# Patient Record
Sex: Male | Born: 1960 | Race: Black or African American | Hispanic: No | Marital: Single | Smoking: Never smoker
Health system: Southern US, Community
[De-identification: ages and names within clinical notes are randomized; demographics above are authoritative.]

## PROBLEM LIST (undated history)

## (undated) DIAGNOSIS — G5600 Carpal tunnel syndrome, unspecified upper limb: Secondary | ICD-10-CM

## (undated) DIAGNOSIS — N182 Chronic kidney disease, stage 2 (mild): Secondary | ICD-10-CM

## (undated) DIAGNOSIS — I10 Essential (primary) hypertension: Secondary | ICD-10-CM

## (undated) DIAGNOSIS — C61 Malignant neoplasm of prostate: Secondary | ICD-10-CM

## (undated) DIAGNOSIS — K209 Esophagitis, unspecified without bleeding: Secondary | ICD-10-CM

## (undated) DIAGNOSIS — C8310 Mantle cell lymphoma, unspecified site: Secondary | ICD-10-CM

---

## 2018-08-30 ENCOUNTER — Encounter (HOSPITAL_BASED_OUTPATIENT_CLINIC_OR_DEPARTMENT_OTHER): Payer: Self-pay | Admitting: Emergency Medicine

## 2018-08-30 ENCOUNTER — Other Ambulatory Visit: Payer: Self-pay

## 2018-08-30 ENCOUNTER — Emergency Department (HOSPITAL_BASED_OUTPATIENT_CLINIC_OR_DEPARTMENT_OTHER)
Admission: EM | Admit: 2018-08-30 | Discharge: 2018-08-30 | Disposition: A | Discharge status: Home / Self Care | Payer: Medicaid Other | Attending: Emergency Medicine | Admitting: Emergency Medicine

## 2018-08-30 DIAGNOSIS — I129 Hypertensive chronic kidney disease with stage 1 through stage 4 chronic kidney disease, or unspecified chronic kidney disease: Secondary | ICD-10-CM | POA: Diagnosis not present

## 2018-08-30 DIAGNOSIS — Z79899 Other long term (current) drug therapy: Secondary | ICD-10-CM | POA: Diagnosis not present

## 2018-08-30 DIAGNOSIS — H5713 Ocular pain, bilateral: Secondary | ICD-10-CM | POA: Insufficient documentation

## 2018-08-30 DIAGNOSIS — N182 Chronic kidney disease, stage 2 (mild): Secondary | ICD-10-CM | POA: Insufficient documentation

## 2018-08-30 DIAGNOSIS — Z8572 Personal history of non-Hodgkin lymphomas: Secondary | ICD-10-CM | POA: Diagnosis not present

## 2018-08-30 DIAGNOSIS — Z9221 Personal history of antineoplastic chemotherapy: Secondary | ICD-10-CM | POA: Insufficient documentation

## 2018-08-30 HISTORY — DX: Chronic kidney disease, stage 2 (mild): N18.2

## 2018-08-30 HISTORY — DX: Carpal tunnel syndrome, unspecified upper limb: G56.00

## 2018-08-30 HISTORY — DX: Mantle cell lymphoma, unspecified site: C83.10

## 2018-08-30 HISTORY — DX: Esophagitis, unspecified without bleeding: K20.90

## 2018-08-30 HISTORY — DX: Essential (primary) hypertension: I10

## 2018-08-30 MED ORDER — FLUORESCEIN SODIUM 1 MG OP STRP
1.0000 | ORAL_STRIP | Freq: Once | OPHTHALMIC | Status: AC
  Administered 2018-08-30: 1 via OPHTHALMIC
  Filled 2018-08-30: qty 1

## 2018-08-30 MED ORDER — CYCLOPENTOLATE HCL 1 % OP SOLN
2.0000 [drp] | Freq: Once | OPHTHALMIC | Status: AC
  Administered 2018-08-30: 2 [drp] via OPHTHALMIC
  Filled 2018-08-30: qty 2

## 2018-08-30 MED ORDER — HYDROCODONE-ACETAMINOPHEN 5-325 MG PO TABS: 1.0000 | ORAL_TABLET | ORAL | 0 refills | Status: AC | PRN

## 2018-08-30 MED ORDER — OFLOXACIN 0.3 % OP SOLN
1.0000 [drp] | OPHTHALMIC | Status: DC
  Administered 2018-08-30: 1 [drp] via OPHTHALMIC
  Filled 2018-08-30: qty 5

## 2018-08-30 MED ORDER — HYDROCODONE-ACETAMINOPHEN 5-325 MG PO TABS
1.0000 | ORAL_TABLET | Freq: Once | ORAL | Status: AC
  Administered 2018-08-30: 1 via ORAL
  Filled 2018-08-30: qty 1

## 2018-08-30 MED ORDER — TETRACAINE HCL 0.5 % OP SOLN
2.0000 [drp] | Freq: Once | OPHTHALMIC | Status: AC
  Administered 2018-08-30: 2 [drp] via OPHTHALMIC
  Filled 2018-08-30: qty 4

## 2018-08-30 NOTE — ED Provider Notes (Addendum)
[facility] Provider Note: Georgena Spurling, MD, FACEP  CSN: [id number] MRN: [id number] ARRIVAL: 08/30/18 at 0357 ROOM: [facility]   HISTORY OF PRESENT ILLNESS  08/30/18 4:11 AM Lucas Parker is a 58 y.o. male with bilateral eye burning and watering since 3 PM yesterday afternoon.  He rates his pain as a 10 out of 10, worse with exposure to light.  His vision is somewhat affected and he describes seeing what looks like a film.  He describes the pain is being present in his eyes diffusely.  He denies any ultraviolet exposure or other trauma to his eyes.  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    Past Medical History:  Diagnosis Date  . Carpal tunnel syndrome   . Esophagitis   . Hypertension   . Mantle cell lymphoma ([place])   . Stage 2 chronic kidney disease     History reviewed. No pertinent surgical history.  No family history on file.  Social History   Tobacco Use  . Smoking status: Not on file  Substance Use Topics  . Alcohol use: Not on file  . Drug use: Not on file    Prior to Admission medications   Medication Sig Start Date End Date Taking? Authorizing Provider  amLODipine (NORVASC) 5 MG tablet TAKE 1 TABLET BY MOUTH DAILY. 06/28/18  Yes [provider]  atorvastatin (LIPITOR) 20 MG tablet Take by mouth. 07/15/18  Yes [provider]  baclofen (LIORESAL) 10 MG tablet Take by mouth. 11/27/17  Yes [provider]  clindamycin (CLEOCIN T) 1 % external solution Apply twice a day on back lesions 01/29/18  Yes [provider]  dapsone 100 MG tablet Take by mouth. 07/31/18  Yes [provider]  Dextromethorphan-guaiFENesin (ROBITUSSIN COUGH+CHEST CONG DM) 20-200 MG/20ML LIQD Take by mouth. 05/03/18  Yes [provider]  famotidine (PEPCID) 10 MG tablet Take by mouth. 07/31/18  Yes [provider]  fluticasone (FLOVENT HFA) 220 MCG/ACT inhaler Inhale  into the lungs. 03/20/18  Yes [provider]  hydrochlorothiazide (HYDRODIURIL) 12.5 MG tablet Take by mouth. 07/15/18  Yes [provider]  hydrOXYzine (ATARAX/VISTARIL) 25 MG tablet Take by mouth. 03/08/18  Yes [provider]  loratadine (CLARITIN) 10 MG tablet Take by mouth. 07/31/18 10/29/18 Yes [provider]  ondansetron (ZOFRAN) 8 MG tablet Take by mouth. 04/01/18  Yes [provider]  pantoprazole (PROTONIX) 40 MG tablet Take by mouth. 07/15/18  Yes [provider]  polyvinyl alcohol (LIQUIFILM TEARS) 1.4 % ophthalmic solution Place 2 drops in both eyes every 4 hours (alternating with PredForte eye drops) until 1/2 at 1:00 am. 01/08/18  Yes [provider]  potassium chloride SA (K-DUR) 20 MEQ tablet Take by mouth. 05/03/18  Yes [provider]  prochlorperazine (COMPAZINE) 10 MG tablet Take by mouth. 11/06/17  Yes [provider]  Zanubrutinib 80 MG CAPS Take by mouth. 05/30/18  Yes [provider]    Allergies Bactrim [sulfamethoxazole-trimethoprim]   REVIEW OF SYSTEMS  Negative except as noted here or in the History of Present Illness.   PHYSICAL EXAMINATION  Initial Vital Signs Blood pressure (!) 152/108, pulse 87, temperature 98.4 F (36.9 C), temperature source Oral, resp. rate 18, height 5\' 8"  (1.727 m), weight 94.3 kg, SpO2 96 %.  Examination General: Well-developed, well-nourished male in no acute distress; appearance consistent with age of record HENT: normocephalic; atraumatic Eyes: pupils equal, round and sluggish; extraocular  muscles intact; no significant conjunctival injection; no hyphema; no hypopyon; negative fluorescein examination; photophobia Neck: supple Heart: regular rate and rhythm Lungs: clear to auscultation bilaterally Abdomen: soft; nondistended; nontender; no masses or hepatosplenomegaly; bowel sounds present Extremities: No deformity; full range of motion; pulses  normal Neurologic: Awake, alert and oriented; motor function intact in all extremities and symmetric; no facial droop Skin: Warm and dry Psychiatric: Normal mood and affect   RESULTS  Summary of this visit's results, reviewed by myself:   EKG Interpretation  Date/Time:    Ventricular Rate:    PR Interval:    QRS Duration:   QT Interval:    QTC Calculation:   R Axis:     Text Interpretation:        Laboratory Studies: No results found for this or any previous visit (from the past 24 hour(s)). Imaging Studies: No results found.  ED COURSE and MDM  Nursing notes and initial vitals signs, including pulse oximetry, reviewed.  Vitals:   08/30/18 0405 08/30/18 0407  BP:  (!) 152/108  Pulse:  87  Resp:  18  Temp:  98.4 F (36.9 C)  TempSrc:  Oral  SpO2:  96%  Weight: 94.3 kg   Height: 5\' 8"  (1.727 m)    4:34 AM Pain not relieved with tetracaine drops.  Patient still has photophobia.  IOP 17 right, 14 left.   5:12 AM No pain relief with Cyclogyl instillation.  Disc margins sharp, no retinal hemorrhages seen.  Cause of his acute eye pain is not clear.  It does not appear to be glaucoma as his IOP's are normal.  There is no significant conjunctival injection to suggest conjunctivitis and tetracaine should have temporarily ease the pain of conjunctivitis.  No corneal abrasions, ulcerations or dendritic lesions were seen on fluorescein them.  We will have him follow-up with the ophthalmologist on-call, Dr. Alanda Slim, later today.  PROCEDURES    ED DIAGNOSES     ICD-10-CM   1. Eye pain, bilateral  H57.13        Zamauri Nez, Jenny Reichmann, MD 08/30/18 [id number]    Shanon Rosser, MD 08/30/18 Delrae Rend

## 2018-08-30 NOTE — ED Triage Notes (Signed)
Pt c/o eye burning and watering since 1500 yesterday.

## 2020-01-24 ENCOUNTER — Emergency Department (HOSPITAL_BASED_OUTPATIENT_CLINIC_OR_DEPARTMENT_OTHER)
Admission: EM | Admit: 2020-01-24 | Discharge: 2020-01-25 | Disposition: A | Discharge status: Home / Self Care | Payer: Medicaid Other | Attending: Emergency Medicine | Admitting: Emergency Medicine

## 2020-01-24 ENCOUNTER — Other Ambulatory Visit: Payer: Self-pay

## 2020-01-24 ENCOUNTER — Emergency Department (HOSPITAL_BASED_OUTPATIENT_CLINIC_OR_DEPARTMENT_OTHER): Payer: Medicaid Other

## 2020-01-24 DIAGNOSIS — Z79899 Other long term (current) drug therapy: Secondary | ICD-10-CM | POA: Diagnosis not present

## 2020-01-24 DIAGNOSIS — R509 Fever, unspecified: Secondary | ICD-10-CM

## 2020-01-24 DIAGNOSIS — N182 Chronic kidney disease, stage 2 (mild): Secondary | ICD-10-CM | POA: Diagnosis not present

## 2020-01-24 DIAGNOSIS — I129 Hypertensive chronic kidney disease with stage 1 through stage 4 chronic kidney disease, or unspecified chronic kidney disease: Secondary | ICD-10-CM | POA: Diagnosis not present

## 2020-01-24 DIAGNOSIS — U071 COVID-19: Secondary | ICD-10-CM

## 2020-01-24 LAB — URINALYSIS, ROUTINE W REFLEX MICROSCOPIC
Bilirubin Urine: NEGATIVE
Glucose, UA: NEGATIVE mg/dL
Hgb urine dipstick: NEGATIVE
Ketones, ur: NEGATIVE mg/dL
Leukocytes,Ua: NEGATIVE
Nitrite: NEGATIVE
Protein, ur: NEGATIVE mg/dL
Specific Gravity, Urine: 1.03 (ref 1.005–1.030)
pH: 5 (ref 5.0–8.0)

## 2020-01-24 LAB — RESP PANEL BY RT-PCR (FLU A&B, COVID) ARPGX2
Influenza A by PCR: NEGATIVE
Influenza B by PCR: NEGATIVE
SARS Coronavirus 2 by RT PCR: POSITIVE — AB

## 2020-01-24 LAB — CBC WITH DIFFERENTIAL/PLATELET
Abs Immature Granulocytes: 0 10*3/uL (ref 0.00–0.07)
Basophils Absolute: 0 10*3/uL (ref 0.0–0.1)
Basophils Relative: 0 %
Eosinophils Absolute: 0 10*3/uL (ref 0.0–0.5)
Eosinophils Relative: 0 %
HCT: 46.1 % (ref 39.0–52.0)
Hemoglobin: 15.3 g/dL (ref 13.0–17.0)
Immature Granulocytes: 0 %
Lymphocytes Relative: 37 %
Lymphs Abs: 1.9 10*3/uL (ref 0.7–4.0)
MCH: 30.9 pg (ref 26.0–34.0)
MCHC: 33.2 g/dL (ref 30.0–36.0)
MCV: 93.1 fL (ref 80.0–100.0)
Monocytes Absolute: 1 10*3/uL (ref 0.1–1.0)
Monocytes Relative: 19 %
Neutro Abs: 2.2 10*3/uL (ref 1.7–7.7)
Neutrophils Relative %: 44 %
Platelets: 182 10*3/uL (ref 150–400)
RBC: 4.95 MIL/uL (ref 4.22–5.81)
RDW: 14.1 % (ref 11.5–15.5)
WBC: 5 10*3/uL (ref 4.0–10.5)
nRBC: 0 % (ref 0.0–0.2)

## 2020-01-24 LAB — LACTIC ACID, PLASMA: Lactic Acid, Venous: 1.4 mmol/L (ref 0.5–1.9)

## 2020-01-24 LAB — COMPREHENSIVE METABOLIC PANEL
ALT: 29 U/L (ref 0–44)
AST: 26 U/L (ref 15–41)
Albumin: 4.4 g/dL (ref 3.5–5.0)
Alkaline Phosphatase: 70 U/L (ref 38–126)
Anion gap: 12 (ref 5–15)
BUN: 13 mg/dL (ref 6–20)
CO2: 25 mmol/L (ref 22–32)
Calcium: 9.1 mg/dL (ref 8.9–10.3)
Chloride: 103 mmol/L (ref 98–111)
Creatinine, Ser: 1.51 mg/dL — ABNORMAL HIGH (ref 0.61–1.24)
GFR, Estimated: 53 mL/min — ABNORMAL LOW (ref >60)
Glucose, Bld: 109 mg/dL — ABNORMAL HIGH (ref 70–99)
Potassium: 4.1 mmol/L (ref 3.5–5.1)
Sodium: 140 mmol/L (ref 135–145)
Total Bilirubin: 0.6 mg/dL (ref 0.3–1.2)
Total Protein: 7.4 g/dL (ref 6.5–8.1)

## 2020-01-24 LAB — PROTIME-INR
INR: 1 (ref 0.8–1.2)
Prothrombin Time: 12.6 seconds (ref 11.4–15.2)

## 2020-01-24 LAB — GROUP A STREP BY PCR: Group A Strep by PCR: NOT DETECTED

## 2020-01-24 MED ORDER — ACETAMINOPHEN 325 MG PO TABS
650.0000 mg | ORAL_TABLET | Freq: Once | ORAL | Status: AC
  Administered 2020-01-24: 650 mg via ORAL
  Filled 2020-01-24: qty 2

## 2020-01-24 NOTE — ED Triage Notes (Addendum)
Pt arrives complaining of cough, headache, chills, bodyaches and back pain starting Wednesday. Fully vaccinated for covid with booster. Hx of lymphoma, tmax 134f. On oral chemotherapy

## 2020-01-24 NOTE — ED Provider Notes (Signed)
[facility] EMERGENCY DEPARTMENT Provider Note   CSN: [id number] Arrival date & time: 01/24/20  1845     History Chief Complaint  Patient presents with  . URI    Lucas Parker is a 60 y.o. male.  HPI Patient has history of mantle cell lymphoma with continued treatment on oral chemotherapeutic agents.  Patient is treated with.Zanibrutinib.  He started developing fever 3 days ago.  Patient's fever was as high as 101.  He has had body aches, headache, cough with shortness of breath.  No nausea or vomiting.  No abdominal pain.  Patient has had [place] immunization.    Past Medical History:  Diagnosis Date  . Carpal tunnel syndrome   . Esophagitis   . Hypertension   . Mantle cell lymphoma ([place])   . Stage 2 chronic kidney disease     There are no problems to display for this patient.   History reviewed. No pertinent surgical history.     History reviewed. No pertinent family history.  Social History   Tobacco Use  . Smoking status: Never Smoker  Substance Use Topics  . Alcohol use: Never  . Drug use: Never    Home Medications Prior to Admission medications   Medication Sig Start Date End Date Taking? Authorizing Provider  amLODipine (NORVASC) 5 MG tablet TAKE 1 TABLET BY MOUTH DAILY. 06/28/18   [provider]  atorvastatin (LIPITOR) 20 MG tablet Take by mouth. 07/15/18   [provider]  baclofen (LIORESAL) 10 MG tablet Take by mouth. 11/27/17   [provider]  clindamycin (CLEOCIN T) 1 % external solution Apply twice a day on back lesions 01/29/18   [provider]  dapsone 100 MG tablet Take by mouth. 07/31/18   [provider]  Dextromethorphan-guaiFENesin (ROBITUSSIN COUGH+CHEST CONG DM) 20-200 MG/20ML LIQD Take by mouth. 05/03/18   [provider]  famotidine (PEPCID) 10 MG tablet Take by mouth. 07/31/18   [provider]  fluticasone (FLOVENT HFA) 220 MCG/ACT inhaler Inhale into the lungs. 03/20/18    [provider]  hydrochlorothiazide (HYDRODIURIL) 12.5 MG tablet Take by mouth. 07/15/18   [provider]  HYDROcodone-acetaminophen (NORCO) 5-325 MG tablet Take 1 tablet by mouth every 4 (four) hours as needed (for pain). 08/30/18   Molpus, John, MD  hydrOXYzine (ATARAX/VISTARIL) 25 MG tablet Take by mouth. 03/08/18   [provider]  loratadine (CLARITIN) 10 MG tablet Take by mouth. 07/31/18 10/29/18  [provider]  ondansetron (ZOFRAN) 8 MG tablet Take by mouth. 04/01/18   [provider]  pantoprazole (PROTONIX) 40 MG tablet Take by mouth. 07/15/18   [provider]  polyvinyl alcohol (LIQUIFILM TEARS) 1.4 % ophthalmic solution Place 2 drops in both eyes every 4 hours (alternating with PredForte eye drops) until 1/2 at 1:00 am. 01/08/18   [provider]  potassium chloride SA (K-DUR) 20 MEQ tablet Take by mouth. 05/03/18   [provider]  prochlorperazine (COMPAZINE) 10 MG tablet Take by mouth. 11/06/17   [provider]  Zanubrutinib 80 MG CAPS Take by mouth. 05/30/18   [provider]    Allergies    Bactrim [sulfamethoxazole-trimethoprim]  Review of Systems   Review of Systems Systems reviewed and negative except as per HPI Physical Exam Updated Vital Signs BP (!) 118/94 (BP Location: Right Arm)   Pulse 87   Temp 99 F (37.2 C) (Oral)   Resp 18   Ht 5\' 8"  (1.727 m)   Wt 97.1  kg   SpO2 98%   BMI 32.54 kg/m   Physical Exam Constitutional:      Comments: Patient is alert.  Nontoxic.  No respiratory distress.  He is uncomfortable in appearance  HENT:     Head: Normocephalic and atraumatic.     Mouth/Throat:     Mouth: Mucous membranes are moist.     Pharynx: Oropharynx is clear.  Eyes:     Extraocular Movements: Extraocular movements intact.  Cardiovascular:     Rate and Rhythm: Normal rate and regular rhythm.  Pulmonary:     Effort: Pulmonary effort is normal.     Breath sounds:  Normal breath sounds.  Abdominal:     General: There is no distension.     Palpations: Abdomen is soft.     Tenderness: There is no abdominal tenderness. There is no guarding.  Musculoskeletal:        General: No swelling or tenderness. Normal range of motion.     Cervical back: Neck supple.     Right lower leg: No edema.     Left lower leg: No edema.  Skin:    General: Skin is warm and dry.     Findings: No rash.  Neurological:     General: No focal deficit present.     Mental Status: He is oriented to person, place, and time.     Coordination: Coordination normal.     ED Results / Procedures / Treatments   Labs (all labs ordered are listed, but only abnormal results are displayed) Labs Reviewed  RESP PANEL BY RT-PCR (FLU A&B, COVID) ARPGX2 - Abnormal; Notable for the following components:      Result Value   SARS Coronavirus 2 by RT PCR POSITIVE (*)    All other components within normal limits  COMPREHENSIVE METABOLIC PANEL - Abnormal; Notable for the following components:   Glucose, Bld 109 (*)    Creatinine, Ser 1.51 (*)    GFR, Estimated 53 (*)    All other components within normal limits  GROUP A STREP BY PCR  URINE CULTURE  CULTURE, BLOOD (ROUTINE X 2)  CULTURE, BLOOD (ROUTINE X 2)  LACTIC ACID, PLASMA  CBC WITH DIFFERENTIAL/PLATELET  PROTIME-INR  URINALYSIS, ROUTINE W REFLEX MICROSCOPIC  LACTIC ACID, PLASMA    EKG None  Radiology DG Chest Port 1 View  Result Date: 01/24/2020 CLINICAL DATA:  Cough and fever EXAM: PORTABLE CHEST 1 VIEW COMPARISON:  March 18, 2018 FINDINGS: The heart size and mediastinal contours are within normal limits. A right-sided MediPort catheter seen with the tip in the mid SVC. Both lungs are clear. The visualized skeletal structures are unremarkable. IMPRESSION: No active disease. Electronically Signed   By: Prudencio Pair M.D.   On: 01/24/2020 23:18    Procedures Procedures (including critical care time)  Medications Ordered in  ED Medications  acetaminophen (TYLENOL) tablet 650 mg (650 mg Oral Given 01/24/20 2220)    ED Course  I have reviewed the triage vital signs and the nursing notes.  Pertinent labs & imaging results that were available during my care of the patient were reviewed by me and considered in my medical decision making (see chart for details).    MDM Rules/Calculators/A&P                          Patient is actively being treated with oral chemotherapy for mantle cell lymphoma.  He has developed fever with cough, headache, body aches.  Patient has been immunized for COVID.  Vital signs are stable with out hypotension or tachycardia.  He does have low-grade fever.  Proceed with diagnostic evaluation for spectated infectious etiology.  At this time, does not appear to meet sepsis criteria.  Will review diagnostic results to determine dispositon.  Patient does test positive for COVID.  However, he is alert and nontoxic.  No respiratory distress.  Lungs are clear on exam and chest x-ray.  No hypoxia at this time.  We will plan for discharge with ambulatory referral placed for COVID treatment clinic. Return precautions reviewed. Final Clinical Impression(s) / ED Diagnoses Final diagnoses:  Fever, unspecified fever cause  COVID    Rx / DC Orders ED Discharge Orders         Ordered    Ambulatory referral for Covid Treatment        01/24/20 2339           Charlesetta Shanks, MD 01/24/20 2341

## 2020-01-24 NOTE — Discharge Instructions (Signed)
1.  A referral has been placed for COVID treatment clinic.  You will be called to discuss treatment options. 2.  Take acetaminophen or ibuprofen for fever and body aches.  Stay hydrated. 3.  Return to the emergency department if you become short of breath, cannot take fluids and become weak or other concerning symptoms.

## 2020-01-24 NOTE — ED Notes (Signed)
Lab called a positive covid. Dr. Dorothyann Gibbs aware

## 2020-01-26 LAB — URINE CULTURE: Culture: NO GROWTH

## 2020-01-30 LAB — CULTURE, BLOOD (ROUTINE X 2)
Culture: NO GROWTH
Culture: NO GROWTH
Special Requests: ADEQUATE

## 2020-02-05 ENCOUNTER — Encounter (HOSPITAL_BASED_OUTPATIENT_CLINIC_OR_DEPARTMENT_OTHER): Payer: Self-pay | Admitting: *Deleted

## 2020-02-05 ENCOUNTER — Emergency Department (HOSPITAL_BASED_OUTPATIENT_CLINIC_OR_DEPARTMENT_OTHER): Payer: Medicaid Other

## 2020-02-05 ENCOUNTER — Other Ambulatory Visit: Payer: Self-pay

## 2020-02-05 ENCOUNTER — Emergency Department (HOSPITAL_BASED_OUTPATIENT_CLINIC_OR_DEPARTMENT_OTHER)
Admission: EM | Admit: 2020-02-05 | Discharge: 2020-02-05 | Disposition: A | Discharge status: Home / Self Care | Payer: Medicaid Other | Attending: Emergency Medicine | Admitting: Emergency Medicine

## 2020-02-05 DIAGNOSIS — I129 Hypertensive chronic kidney disease with stage 1 through stage 4 chronic kidney disease, or unspecified chronic kidney disease: Secondary | ICD-10-CM | POA: Insufficient documentation

## 2020-02-05 DIAGNOSIS — J181 Lobar pneumonia, unspecified organism: Secondary | ICD-10-CM | POA: Insufficient documentation

## 2020-02-05 DIAGNOSIS — R000 Tachycardia, unspecified: Secondary | ICD-10-CM | POA: Diagnosis not present

## 2020-02-05 DIAGNOSIS — Z79899 Other long term (current) drug therapy: Secondary | ICD-10-CM | POA: Diagnosis not present

## 2020-02-05 DIAGNOSIS — N182 Chronic kidney disease, stage 2 (mild): Secondary | ICD-10-CM | POA: Diagnosis not present

## 2020-02-05 DIAGNOSIS — U099 Post covid-19 condition, unspecified: Secondary | ICD-10-CM | POA: Diagnosis not present

## 2020-02-05 DIAGNOSIS — J189 Pneumonia, unspecified organism: Secondary | ICD-10-CM

## 2020-02-05 DIAGNOSIS — R059 Cough, unspecified: Secondary | ICD-10-CM | POA: Diagnosis present

## 2020-02-05 LAB — CBC WITH DIFFERENTIAL/PLATELET
Abs Immature Granulocytes: 0.1 10*3/uL — ABNORMAL HIGH (ref 0.00–0.07)
Basophils Absolute: 0 10*3/uL (ref 0.0–0.1)
Basophils Relative: 0 %
Eosinophils Absolute: 0.1 10*3/uL (ref 0.0–0.5)
Eosinophils Relative: 1 %
HCT: 43.8 % (ref 39.0–52.0)
Hemoglobin: 14.5 g/dL (ref 13.0–17.0)
Immature Granulocytes: 2 %
Lymphocytes Relative: 39 %
Lymphs Abs: 2.5 10*3/uL (ref 0.7–4.0)
MCH: 30.5 pg (ref 26.0–34.0)
MCHC: 33.1 g/dL (ref 30.0–36.0)
MCV: 92 fL (ref 80.0–100.0)
Monocytes Absolute: 1 10*3/uL (ref 0.1–1.0)
Monocytes Relative: 16 %
Neutro Abs: 2.7 10*3/uL (ref 1.7–7.7)
Neutrophils Relative %: 42 %
Platelets: 211 10*3/uL (ref 150–400)
RBC: 4.76 MIL/uL (ref 4.22–5.81)
RDW: 13.3 % (ref 11.5–15.5)
WBC: 6.4 10*3/uL (ref 4.0–10.5)
nRBC: 0 % (ref 0.0–0.2)

## 2020-02-05 LAB — COMPREHENSIVE METABOLIC PANEL
ALT: 28 U/L (ref 0–44)
AST: 25 U/L (ref 15–41)
Albumin: 4.3 g/dL (ref 3.5–5.0)
Alkaline Phosphatase: 72 U/L (ref 38–126)
Anion gap: 11 (ref 5–15)
BUN: 13 mg/dL (ref 8–23)
CO2: 28 mmol/L (ref 22–32)
Calcium: 9.4 mg/dL (ref 8.9–10.3)
Chloride: 99 mmol/L (ref 98–111)
Creatinine, Ser: 1.48 mg/dL — ABNORMAL HIGH (ref 0.61–1.24)
GFR, Estimated: 53 mL/min — ABNORMAL LOW (ref >60)
Glucose, Bld: 109 mg/dL — ABNORMAL HIGH (ref 70–99)
Potassium: 3.7 mmol/L (ref 3.5–5.1)
Sodium: 138 mmol/L (ref 135–145)
Total Bilirubin: 0.5 mg/dL (ref 0.3–1.2)
Total Protein: 7.8 g/dL (ref 6.5–8.1)

## 2020-02-05 LAB — TROPONIN I (HIGH SENSITIVITY): Troponin I (High Sensitivity): 3 ng/L (ref <18)

## 2020-02-05 NOTE — Discharge Instructions (Addendum)
seen here for weakness. X-ray shows that you have a small pneumonia, you currently on antibiotics that will cover you for pneumonia. Please continue to take them. I recommend Tylenol for fever control and ibuprofen for pain control please follow dosing the back of bottle. . If you do not have an appetite I recommend soups as this will provide you with fluids and calories. Please remember to stay hydrated.  Please follow-up with your primary care provider in 4 days time for repeat chest x-ray to ensure this is resolving.  Come back to the emergency department if you develop chest pain, shortness of breath, severe abdominal pain, uncontrolled nausea, vomiting, diarrhea.

## 2020-02-05 NOTE — ED Provider Notes (Signed)
[facility] EMERGENCY DEPARTMENT Provider Note   CSN: [id number] Arrival date & time: 02/05/20  1254     History Chief Complaint  Patient presents with  . Fever  . Cough  . Covid Positive    Lucas Parker is a 60 y.o. male.  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  Patient endorses  that he was diagnosed with Covid on 01/15, symptoms started around 01/8.  Patient was referred to the [facility] infusion clinic but never received a call for infusion.  He is vaccine against COVID-19.  on Monday patient was is not feeling well contacted his PCP who started him on amoxicillin and azithromycin, tolerating the medications without difficulty.  Patient is now here because he states he is feels more weak than before, states he has not had an appetite and has had intermittent fevers and chills.  He denies chest pain, shortness of breath, abdominal pain, nausea, vomiting, diarrhea, general malaise pedal edema.  He has no history of PEs or DVTs. He has been taking over-the-counter pain medication at difficulty.  He is tolerating p.o. but just does not have an appetite.  Denies any alleviating factors Past Medical History:  Diagnosis Date  . Carpal tunnel syndrome   . Esophagitis   . Hypertension   . Mantle cell lymphoma ([place])   . Stage 2 chronic kidney disease     There are no problems to display for this patient.   History reviewed. No pertinent surgical history.     History reviewed. No pertinent family history.  Social History   Tobacco Use  . Smoking status: Never Smoker  . Smokeless tobacco: Never Used  Substance Use Topics  . Alcohol use: Never  . Drug use: Never    Home Medications Prior to Admission medications   Medication Sig Start Date End Date Taking? Authorizing Provider   albuterol (VENTOLIN HFA) 108 (90 Base) MCG/ACT inhaler Inhale 1-2 puffs into the lungs every 6 (six) hours as needed for wheezing or shortness of breath.   Yes [provider]  amoxicillin (AMOXIL) 500 MG capsule Take 1,000 mg by mouth 2 (two) times daily. 7 days   Yes [provider]  azithromycin (ZITHROMAX) 250 MG tablet Take 250 mg by mouth daily.   Yes [provider]  amLODipine (NORVASC) 5 MG tablet TAKE 1 TABLET BY MOUTH DAILY. 06/28/18   [provider]  atorvastatin (LIPITOR) 20 MG tablet Take by mouth. 07/15/18   [provider]  baclofen (LIORESAL) 10 MG tablet Take by mouth. 11/27/17   [provider]  clindamycin (CLEOCIN T) 1 % external solution Apply twice a day on back lesions 01/29/18   [provider]  dapsone 100 MG tablet Take by mouth. 07/31/18   [provider]  Dextromethorphan-guaiFENesin (ROBITUSSIN COUGH+CHEST CONG DM) 20-200 MG/20ML LIQD Take by mouth. 05/03/18   [provider]  famotidine (PEPCID) 10 MG tablet Take by mouth. 07/31/18   [provider]  fluticasone (FLOVENT HFA) 220 MCG/ACT inhaler Inhale into the lungs. 03/20/18   [provider]  hydrochlorothiazide (HYDRODIURIL) 12.5 MG tablet Take by mouth. 07/15/18   [provider]  HYDROcodone-acetaminophen (NORCO) 5-325 MG tablet Take 1 tablet by mouth every 4 (four) hours as needed (for pain). 08/30/18   Molpus, John, MD  hydrOXYzine (ATARAX/VISTARIL) 25 MG tablet Take by mouth.  03/08/18   [provider]  loratadine (CLARITIN) 10 MG tablet Take by mouth. 07/31/18 10/29/18  [provider]  ondansetron (ZOFRAN) 8 MG tablet Take by mouth. 04/01/18   [provider]  pantoprazole (PROTONIX) 40 MG tablet Take by mouth. 07/15/18   [provider]  polyvinyl alcohol (LIQUIFILM TEARS) 1.4 % ophthalmic solution Place 2 drops in both eyes every 4 hours (alternating with PredForte eye drops)  until 1/2 at 1:00 am. 01/08/18   [provider]  potassium chloride SA (K-DUR) 20 MEQ tablet Take by mouth. 05/03/18   [provider]  prochlorperazine (COMPAZINE) 10 MG tablet Take by mouth. 11/06/17   [provider]  Zanubrutinib 80 MG CAPS Take by mouth. 05/30/18   [provider]    Allergies    Bactrim [sulfamethoxazole-trimethoprim]  Review of Systems   Review of Systems  Constitutional: Positive for appetite change, chills and fever.  HENT: Negative for congestion and sore throat.   Respiratory: Positive for cough. Negative for shortness of breath.   Cardiovascular: Negative for chest pain.  Gastrointestinal: Negative for abdominal pain, diarrhea, nausea and vomiting.  Genitourinary: Negative for enuresis, flank pain and frequency.  Musculoskeletal: Negative for back pain and myalgias.  Skin: Negative for rash.  Neurological: Positive for headaches. Negative for dizziness.  Hematological: Does not bruise/bleed easily.    Physical Exam Updated Vital Signs BP 125/90 (BP Location: Left Arm)   Pulse 97   Temp 99.3 F (37.4 C) (Oral)   Resp 18   Ht 5\' 8"  (1.727 m)   Wt 95.7 kg   SpO2 100%   BMI 32.08 kg/m   Physical Exam Vitals and nursing note reviewed.  Constitutional:      General: He is not in acute distress.    Appearance: He is not ill-appearing.  HENT:     Head: Normocephalic and atraumatic.     Right Ear: Tympanic membrane, ear canal and external ear normal.     Left Ear: Tympanic membrane, ear canal and external ear normal.     Nose: No congestion.     Mouth/Throat:     Mouth: Mucous membranes are moist.     Pharynx: Oropharynx is clear. No oropharyngeal exudate or posterior oropharyngeal erythema.  Eyes:     Conjunctiva/sclera: Conjunctivae normal.  Cardiovascular:     Rate and Rhythm: Regular rhythm. Tachycardia present.     Pulses: Normal pulses.     Heart sounds: No murmur heard. No friction rub. No gallop.    Pulmonary:     Effort: No respiratory distress.     Breath sounds: No wheezing, rhonchi or rales.  Abdominal:     Palpations: Abdomen is soft.     Tenderness: There is no abdominal tenderness.  Musculoskeletal:     Comments: Patient is moving all 4 extremities at difficulty.  Skin:    General: Skin is warm and dry.  Neurological:     General: No focal deficit present.     Mental Status: He is alert.  Psychiatric:        Mood and Affect: Mood normal.     ED Results / Procedures / Treatments   Labs (all labs ordered are listed, but only abnormal results are displayed) Labs Reviewed  COMPREHENSIVE METABOLIC PANEL - Abnormal; Notable for the following components:      Result Value   Glucose, Bld 109 (*)    Creatinine, Ser 1.48 (*)    GFR, Estimated 53 (*)  All other components within normal limits  CBC WITH DIFFERENTIAL/PLATELET - Abnormal; Notable for the following components:   Abs Immature Granulocytes 0.10 (*)    All other components within normal limits  TROPONIN I (HIGH SENSITIVITY)    EKG EKG Interpretation  Date/Time:  Thursday February 05 2020 16:29:33 EST Ventricular Rate:  97 PR Interval:  140 QRS Duration: 78 QT Interval:  352 QTC Calculation: 447 R Axis:   75 Text Interpretation: Normal sinus rhythm Normal ECG No STEMI Confirmed by Octaviano Glow [phone]) on 02/05/2020 5:05:59 PM   Radiology DG Chest 2 View  Result Date: 02/05/2020 CLINICAL DATA:  Persistent cough.  COVID-19 positive 2 weeks ago. EXAM: CHEST - 2 VIEW COMPARISON:  Chest x-ray from same day at 1559. Chest x-ray dated January 24, 2020 FINDINGS: Unchanged right chest wall port catheter. The heart size and mediastinal contours are within normal limits. Normal pulmonary vascularity. Focal opacities in the left lower lobe and peripheral right upper lobe, more conspicuous when compared to the study from earlier today and certainly new compared to the prior study from 12 days ago. No pleural  effusion or pneumothorax. Unchanged linear scarring in the lingula. No acute osseous abnormality. IMPRESSION: 1. Very mild multifocal pneumonia, new since 12 days ago. Electronically Signed   By: Titus Dubin M.D.   On: 02/05/2020 16:31   DG Chest Port 1 View  Result Date: 02/05/2020 CLINICAL DATA:  Persistent cough.  COVID-19 positive 2 weeks ago. EXAM: PORTABLE CHEST 1 VIEW COMPARISON:  Chest x-ray dated January 24, 2020. FINDINGS: Unchanged right chest wall port catheter. The heart size and mediastinal contours are within normal limits. Both lungs are clear. The visualized skeletal structures are unremarkable. IMPRESSION: No active disease. Electronically Signed   By: Titus Dubin M.D.   On: 02/05/2020 16:26    Procedures Procedures   Medications Ordered in ED Medications - No data to display  ED Course  I have reviewed the triage vital signs and the nursing notes.  Pertinent labs & imaging results that were available during my care of the patient were reviewed by me and considered in my medical decision making (see chart for details).    MDM Rules/Calculators/A&P                          Patient presents with URI-like symptoms and fatigue.  He is alert, does not appear to be in acute distress, vital signs show fever and tachycardia.  Will obtain basic lab work, EKG, troponin, chest x-ray for further evaluation.  CBC negative for leukocytosis or signs anemia.  CMP shows no electrolyte abnormalities, hyperglycemia 109, creatinine appears to be at baseline, no elevated liver enzymes, no anion gap present.  Initial troponin is 3. Chest x-ray shows minimal multifocal pneumonia.  EKG shows sinus rhythm without signs of ischemia no ST elevation depression noted.  I have low suspicion for ACS as patient denies chest pain, shortness of breath, initial troponin is 3, EKG is sinus rhythm without signs of ischemia.  Will defer second troponin as patient has been chest pain-free for the last  12 hours.  Low suspicion for PE as patient denies pleuritic chest pain, shortness of breath, vital signs are reassuring, patient has no history of DVTs or PEs. Low suspicion for systemic infection as patient is nontoxic-appearing, vital signs reassuring, no obvious source infection noted on exam. low suspicion for strep throat as oropharynx was visualized, no erythema or exudates noted.  Low suspicion patient would need  hospitalized due to viral infection or Covid as vital signs reassuring, patient is not in respiratory distress.  I suspect patient had a viral infection which turned into pneumonia.  Patient is currently on azithromycin and amoxicillin, I find this acceptable will encourage continuation of his regiment and follow-up with his PCP in 1 week's time for reevaluation.  Vital signs have remained stable, no indication for hospital admission.  Patient discussed with attending and they agreed with assessment and plan.  Patient given at home care as well strict return precautions.  Patient verbalized that they understood agreed to said plan.    Final Clinical Impression(s) / ED Diagnoses Final diagnoses:  Community acquired pneumonia, unspecified laterality    Rx / DC Orders ED Discharge Orders    None       Aron Baba 02/05/20 1829    Wyvonnia Dusky, MD 02/06/20 [phone]

## 2020-02-05 NOTE — ED Triage Notes (Signed)
Tested Covid + on the 15th.  Still has cough, T=100.5 an hour ago PTA.

## 2020-02-06 NOTE — ED Provider Notes (Signed)
Addendum -  From medical record review it appears the patient is on Amoxicillin and Azithromycin (although he had verbally confirmed he was on Augmentin and Azithromycin).  I'm concerned amoxicillin alone may not have adequate coverage for CAP.  I called in a prescription to his Product/process development scientist on [address] in [place] for Levaquin 750 mg daily x 5 days.  I called twice at the cell phone number listed for Mr. Valdez - no answer - but did leave a voicemail with instructions to STOP amoxicillin, START levaquin, and CONTINUE azithromycin to completion.     Wyvonnia Dusky, MD 02/06/20 [phone]

## 2021-07-20 ENCOUNTER — Encounter (HOSPITAL_BASED_OUTPATIENT_CLINIC_OR_DEPARTMENT_OTHER): Payer: Self-pay | Admitting: Emergency Medicine

## 2021-07-20 ENCOUNTER — Emergency Department (HOSPITAL_BASED_OUTPATIENT_CLINIC_OR_DEPARTMENT_OTHER): Payer: Medicare HMO

## 2021-07-20 ENCOUNTER — Other Ambulatory Visit: Payer: Self-pay

## 2021-07-20 ENCOUNTER — Emergency Department (HOSPITAL_BASED_OUTPATIENT_CLINIC_OR_DEPARTMENT_OTHER)
Admission: EM | Admit: 2021-07-20 | Discharge: 2021-07-20 | Disposition: A | Discharge status: Home / Self Care | Payer: Medicare HMO | Attending: Emergency Medicine | Admitting: Emergency Medicine

## 2021-07-20 DIAGNOSIS — R110 Nausea: Secondary | ICD-10-CM | POA: Diagnosis not present

## 2021-07-20 DIAGNOSIS — Z79899 Other long term (current) drug therapy: Secondary | ICD-10-CM | POA: Diagnosis not present

## 2021-07-20 DIAGNOSIS — R059 Cough, unspecified: Secondary | ICD-10-CM | POA: Diagnosis not present

## 2021-07-20 DIAGNOSIS — R0789 Other chest pain: Secondary | ICD-10-CM | POA: Diagnosis present

## 2021-07-20 DIAGNOSIS — R079 Chest pain, unspecified: Secondary | ICD-10-CM

## 2021-07-20 HISTORY — DX: Malignant neoplasm of prostate: C61

## 2021-07-20 LAB — BASIC METABOLIC PANEL
Anion gap: 7 (ref 5–15)
BUN: 15 mg/dL (ref 8–23)
CO2: 26 mmol/L (ref 22–32)
Calcium: 9.5 mg/dL (ref 8.9–10.3)
Chloride: 108 mmol/L (ref 98–111)
Creatinine, Ser: 1.41 mg/dL — ABNORMAL HIGH (ref 0.61–1.24)
GFR, Estimated: 56 mL/min — ABNORMAL LOW (ref >60)
Glucose, Bld: 104 mg/dL — ABNORMAL HIGH (ref 70–99)
Potassium: 3.5 mmol/L (ref 3.5–5.1)
Sodium: 141 mmol/L (ref 135–145)

## 2021-07-20 LAB — TROPONIN I (HIGH SENSITIVITY)
Troponin I (High Sensitivity): 3 ng/L (ref <18)
Troponin I (High Sensitivity): 3 ng/L (ref <18)

## 2021-07-20 LAB — CBC
HCT: 43.1 % (ref 39.0–52.0)
Hemoglobin: 14.3 g/dL (ref 13.0–17.0)
MCH: 30.9 pg (ref 26.0–34.0)
MCHC: 33.2 g/dL (ref 30.0–36.0)
MCV: 93.1 fL (ref 80.0–100.0)
Platelets: 237 10*3/uL (ref 150–400)
RBC: 4.63 MIL/uL (ref 4.22–5.81)
RDW: 13.7 % (ref 11.5–15.5)
WBC: 6.7 10*3/uL (ref 4.0–10.5)
nRBC: 0 % (ref 0.0–0.2)

## 2021-07-20 MED ORDER — HEPARIN SOD (PORK) LOCK FLUSH 100 UNIT/ML IV SOLN
500.0000 [IU] | Freq: Once | INTRAVENOUS | Status: AC
  Administered 2021-07-20: 500 [IU]
  Filled 2021-07-20: qty 5

## 2021-07-20 MED ORDER — IOHEXOL 350 MG/ML SOLN
100.0000 mL | Freq: Once | INTRAVENOUS | Status: AC | PRN
Start: 2021-07-20 — End: 2021-07-20
  Administered 2021-07-20: 100 mL via INTRAVENOUS

## 2021-07-20 NOTE — Discharge Instructions (Signed)
Your work-up here did not show any significant abnormality  Follow-up with your primary care provider, return for new or worsening symptoms peer

## 2021-07-20 NOTE — ED Triage Notes (Signed)
Pt reports he has been having chest and L shoulder pain for the past 2 week that got worse 30 minutes ago. Had some nausea, but no shortness of breath or lightheadedness. Pt on chemo pills for lymphoma.

## 2021-07-20 NOTE — ED Provider Notes (Signed)
Care assumed from previous provider at shift change.  See note for full HPI.  In summation 61 year old history of lymphoma followed at [facility] here for evaluation of chest pain.  Per previous provider did not sound like ACS, unstable angina.  He is getting CTA chest felt to need following up on.  If without PE plan to DC home with close outpatient follow-up   Physical Exam  BP (!) 134/91 (BP Location: Right Arm)   Pulse 67   Temp 98.2 F (36.8 C) (Oral)   Resp 19   SpO2 97%   Physical Exam Vitals and nursing note reviewed.  Constitutional:      General: He is not in acute distress.    Appearance: He is well-developed. He is not ill-appearing or diaphoretic.  HENT:     Head: Atraumatic.  Eyes:     Pupils: Pupils are equal, round, and reactive to light.  Cardiovascular:     Rate and Rhythm: Normal rate and regular rhythm.  Pulmonary:     Effort: Pulmonary effort is normal. No respiratory distress.  Chest:     Comments: Port RU chest wall Abdominal:     General: There is no distension.     Palpations: Abdomen is soft.  Musculoskeletal:        General: Normal range of motion.     Cervical back: Normal range of motion and neck supple.  Skin:    General: Skin is warm and dry.  Neurological:     General: No focal deficit present.     Mental Status: He is alert and oriented to person, place, and time.     Procedures  Procedures Labs Reviewed  BASIC METABOLIC PANEL - Abnormal; Notable for the following components:      Result Value   Glucose, Bld 104 (*)    Creatinine, Ser 1.41 (*)    GFR, Estimated 56 (*)    All other components within normal limits  CBC  TROPONIN I (HIGH SENSITIVITY)  TROPONIN I (HIGH SENSITIVITY)   CT Angio Chest PE W and/or Wo Contrast  Result Date: 07/20/2021 CLINICAL DATA:  Chest and left shoulder pain history of lymphoma EXAM: CT ANGIOGRAPHY CHEST WITH CONTRAST TECHNIQUE: Multidetector CT imaging of the chest was performed using the standard  protocol during bolus administration of intravenous contrast. Multiplanar CT image reconstructions and MIPs were obtained to evaluate the vascular anatomy. RADIATION DOSE REDUCTION: This exam was performed according to the departmental dose-optimization program which includes automated exposure control, adjustment of the mA and/or kV according to patient size and/or use of iterative reconstruction technique. CONTRAST:  157m OMNIPAQUE IOHEXOL 350 MG/ML SOLN COMPARISON:  Chest x-ray 07/20/2021, CT chest 03/07/2021 FINDINGS: Cardiovascular: Satisfactory opacification of the pulmonary arteries to the segmental level. No evidence of pulmonary embolism. Normal heart size. No pericardial effusion. Central venous catheter tip at the cavoatrial region. Mediastinum/Nodes: Midline trachea. No thyroid mass. Borderline right axillary nodes without significant change. Esophagus normal Lungs/Pleura: No acute airspace disease, pleural effusion, or pneumothorax. Mild subpleural reticulation and linear density likely scarring. Upper Abdomen: No acute abnormality. Musculoskeletal: No chest wall abnormality. No acute or significant osseous findings. Review of the MIP images confirms the above findings. IMPRESSION: 1. Negative for acute pulmonary embolus or aortic dissection 2. Clear lung fields Electronically Signed   By: KDonavan FoilM.D.   On: 07/20/2021 20:29   DG Chest 2 View  Result Date: 07/20/2021 CLINICAL DATA:  Chest pain EXAM: CHEST - 2 VIEW COMPARISON:  Radiograph 12/18/2020  FINDINGS: Chest port catheter tip overlies the mid SVC. Unchanged cardiomediastinal silhouette. There is no focal airspace consolidation. There is no pleural effusion. No pneumothorax. There is no acute osseous abnormality. Mild degenerative changes of the spine. IMPRESSION: No evidence of acute cardiopulmonary disease. Electronically Signed   By: Maurine Simmering M.D.   On: 07/20/2021 15:17    ED Course / MDM    Medical Decision Making Amount  and/or Complexity of Data Reviewed Labs: ordered. Radiology: ordered.  Risk Prescription drug management.   Labs and imaging personally viewed and interpreted   FU on CTA chest if neg dc home with Cards FU  CTA does not show any PE, pneumonia, pneumothorax, effusion  Discussed results with patient, family in room.  They will follow-up outpatient, return for any worsening symptoms  Patient is to be discharged with recommendation to follow up with PCP in regards to today's hospital visit. Chest pain is not likely of cardiac or pulmonary etiology d/t presentation, PERC negative, VSS, no tracheal deviation, no JVD or new murmur, RRR, breath sounds equal bilaterally, EKG without acute abnormalities, negative troponin, and negative CXR. Pt has been advised to return to the ED if CP becomes exertional, associated with diaphoresis or nausea, radiates to left jaw/arm, worsens or becomes concerning in any way. Pt appears reliable for follow up and is agreeable to discharge.         Adrian Prince 07/20/21 2133    Luna Fuse, MD 08/02/21 [phone]

## 2021-07-20 NOTE — ED Provider Notes (Signed)
[facility] EMERGENCY DEPARTMENT Provider Note   CSN: [id number] Arrival date & time: 07/20/21  1434     History  Chief Complaint  Patient presents with   Chest Pain    Linken Mcglothen is a 61 y.o. male.  The history is provided by the patient. No language interpreter was used.  Chest Pain Pain location:  L chest Pain quality: aching   Pain radiates to:  L shoulder Pain severity:  Mild Onset quality:  Gradual Duration:  2 weeks Timing:  Constant Progression:  Worsening Chronicity:  New Context: not movement   Relieved by:  Nothing Worsened by:  Nothing Ineffective treatments:  None tried Associated symptoms: cough and nausea   Risk factors: male sex        Home Medications Prior to Admission medications   Medication Sig Start Date End Date Taking? Authorizing Provider  albuterol (VENTOLIN HFA) 108 (90 Base) MCG/ACT inhaler Inhale 1-2 puffs into the lungs every 6 (six) hours as needed for wheezing or shortness of breath.    [provider]  amLODipine (NORVASC) 5 MG tablet TAKE 1 TABLET BY MOUTH DAILY. 06/28/18   [provider]  amoxicillin (AMOXIL) 500 MG capsule Take 1,000 mg by mouth 2 (two) times daily. 7 days    [provider]  atorvastatin (LIPITOR) 20 MG tablet Take by mouth. 07/15/18   [provider]  azithromycin (ZITHROMAX) 250 MG tablet Take 250 mg by mouth daily.    [provider]  baclofen (LIORESAL) 10 MG tablet Take by mouth. 11/27/17   [provider]  clindamycin (CLEOCIN T) 1 % external solution Apply twice a day on back lesions 01/29/18   [provider]  dapsone 100 MG tablet Take by mouth. 07/31/18   [provider]  Dextromethorphan-guaiFENesin (ROBITUSSIN COUGH+CHEST CONG DM) 20-200 MG/20ML LIQD Take by mouth. 05/03/18   [provider]  famotidine (PEPCID) 10 MG tablet Take by mouth. 07/31/18   [provider]  fluticasone (FLOVENT HFA) 220 MCG/ACT  inhaler Inhale into the lungs. 03/20/18   [provider]  hydrochlorothiazide (HYDRODIURIL) 12.5 MG tablet Take by mouth. 07/15/18   [provider]  HYDROcodone-acetaminophen (NORCO) 5-325 MG tablet Take 1 tablet by mouth every 4 (four) hours as needed (for pain). 08/30/18   Molpus, John, MD  hydrOXYzine (ATARAX/VISTARIL) 25 MG tablet Take by mouth. 03/08/18   [provider]  loratadine (CLARITIN) 10 MG tablet Take by mouth. 07/31/18 10/29/18  [provider]  ondansetron (ZOFRAN) 8 MG tablet Take by mouth. 04/01/18   [provider]  pantoprazole (PROTONIX) 40 MG tablet Take by mouth. 07/15/18   [provider]  polyvinyl alcohol (LIQUIFILM TEARS) 1.4 % ophthalmic solution Place 2 drops in both eyes every 4 hours (alternating with PredForte eye drops) until 1/2 at 1:00 am. 01/08/18   [provider]  potassium chloride SA (K-DUR) 20 MEQ tablet Take by mouth. 05/03/18   [provider]  prochlorperazine (COMPAZINE) 10 MG tablet Take by mouth. 11/06/17   [provider]  Zanubrutinib 80 MG CAPS Take by mouth. 05/30/18   [provider]      Allergies    Lisinopril and Bactrim [sulfamethoxazole-trimethoprim]    Review of Systems   Review of Systems  Respiratory:  Positive for cough.   Cardiovascular:  Positive for chest pain.  Gastrointestinal:  Positive for nausea.  All other systems reviewed and are negative.   Physical Exam Updated Vital Signs BP 135/86  Pulse 65   Temp 98.2 F (36.8 C) (Oral)   Resp 18   SpO2 95%  Physical Exam Vitals and nursing note reviewed.  Constitutional:      Appearance: He is well-developed.  HENT:     Head: Normocephalic.  Cardiovascular:     Rate and Rhythm: Normal rate and regular rhythm.     Heart sounds: Normal heart sounds.  Pulmonary:     Effort: Pulmonary effort is normal.     Breath sounds: Normal breath sounds.  Chest:     Chest wall: No mass.   Abdominal:     General: There is no distension.     Palpations: Abdomen is soft.  Musculoskeletal:        General: Normal range of motion.     Cervical back: Normal range of motion.  Skin:    General: Skin is warm.  Neurological:     General: No focal deficit present.     Mental Status: He is alert and oriented to person, place, and time.  Psychiatric:        Mood and Affect: Mood normal.     ED Results / Procedures / Treatments   Labs (all labs ordered are listed, but only abnormal results are displayed) Labs Reviewed  BASIC METABOLIC PANEL - Abnormal; Notable for the following components:      Result Value   Glucose, Bld 104 (*)    Creatinine, Ser 1.41 (*)    GFR, Estimated 56 (*)    All other components within normal limits  CBC  TROPONIN I (HIGH SENSITIVITY)  TROPONIN I (HIGH SENSITIVITY)    EKG None  Radiology DG Chest 2 View  Result Date: 07/20/2021 CLINICAL DATA:  Chest pain EXAM: CHEST - 2 VIEW COMPARISON:  Radiograph 12/18/2020 FINDINGS: Chest port catheter tip overlies the mid SVC. Unchanged cardiomediastinal silhouette. There is no focal airspace consolidation. There is no pleural effusion. No pneumothorax. There is no acute osseous abnormality. Mild degenerative changes of the spine. IMPRESSION: No evidence of acute cardiopulmonary disease. Electronically Signed   By: Maurine Simmering M.D.   On: 07/20/2021 15:17    Procedures Procedures    Medications Ordered in ED Medications - No data to display  ED Course/ Medical Decision Making/ A&P                           Medical Decision Making Amount and/or Complexity of Data Reviewed Independent Historian: spouse External Data Reviewed: notes.    Details: Hematology/Oncology notes reviewed Labs: ordered. Decision-making details documented in ED Course.    Details: Labs ordered reviewed and interpreted.  troponin is negative Radiology: ordered and independent interpretation performed. Decision-making details  documented in ED Course.    Details: Chest xray  no acute abnormality ECG/medicine tests: ordered and independent interpretation performed. Decision-making details documented in ED Course.    Details: EKG  no acute  Risk Risk Details: Ct angio chest ordered.  Pt has lymphoma and is being treated at [facility]     Pt's care turned over to [facility].  Ct angio and second troponin pending         Final Clinical Impression(s) / ED Diagnoses Final diagnoses:  Chest pain, unspecified type    Rx / DC Orders ED Discharge Orders     None         Sidney Ace 07/20/21 1836    Luna Fuse, MD 08/02/21 [phone]

## 2021-07-20 NOTE — ED Notes (Signed)
Patient with accessed port to R chest.  Reports port was accessed here today.  Dressing intact.  No complaints of pain at site.

## 2021-07-20 NOTE — ED Notes (Signed)
ED Provider at bedside. 

## 2022-07-16 IMAGING — CR DG CHEST 2V
2 series · 2 of 2 positions shown · non-contrast
Comparison: Chest x-ray from same day at 3119. Chest x-ray dated
January 24, 2020

CLINICAL DATA: Persistent cough.  IOBB1-TM positive 2 weeks ago.

EXAM:
CHEST - 2 VIEW

[w chest pa]
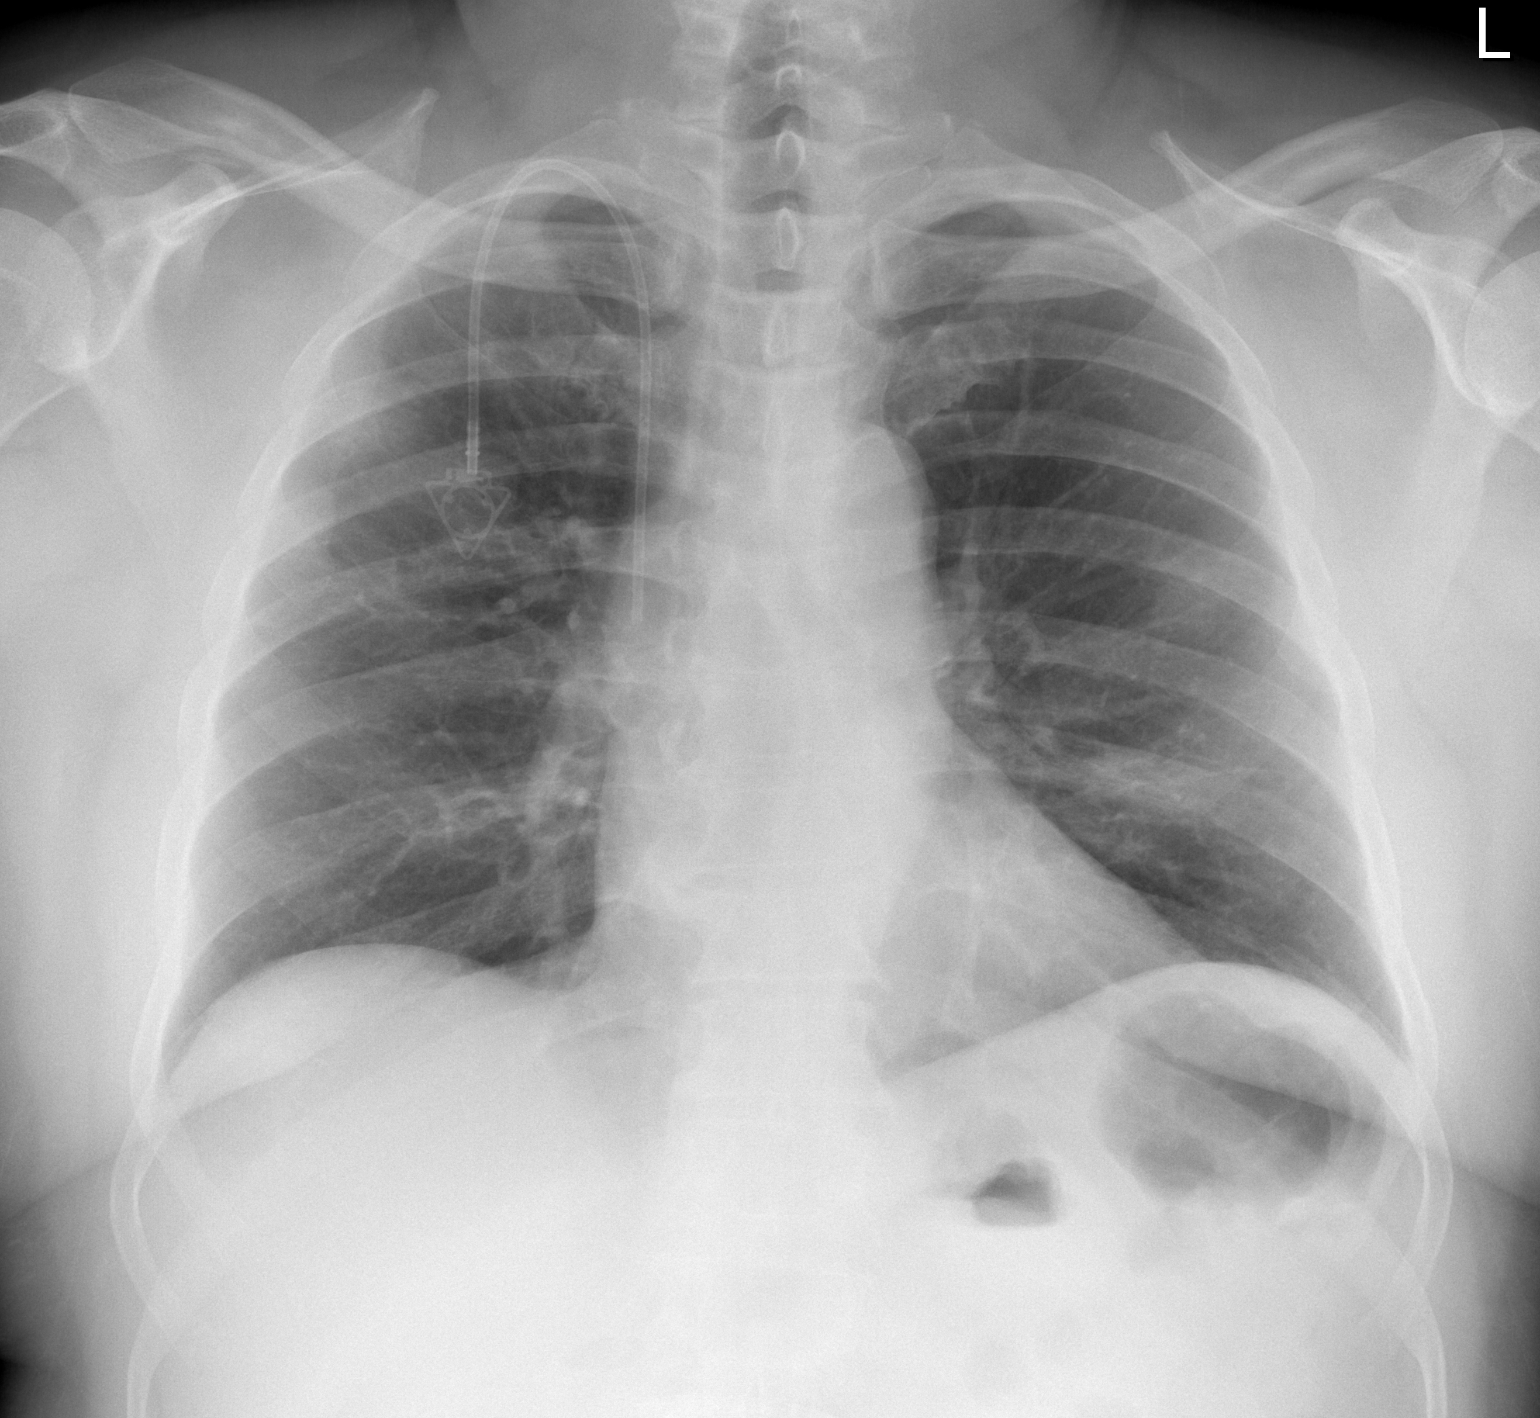

[w chest lat]
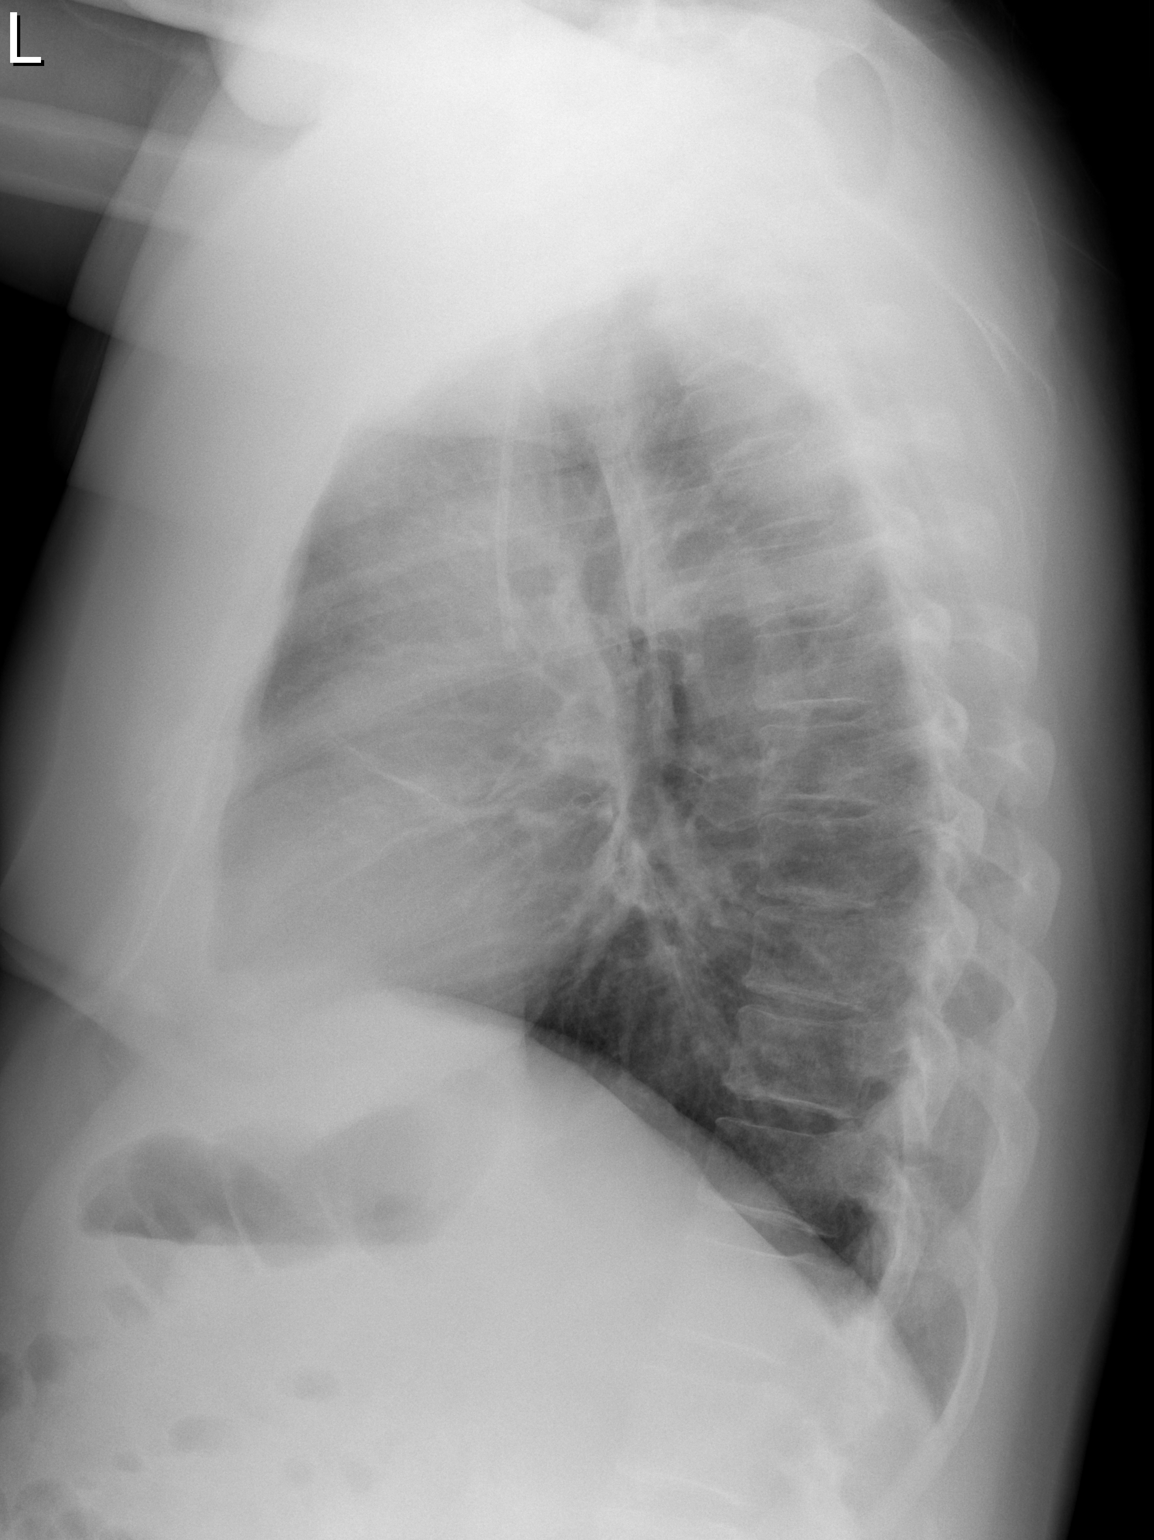

[2 of 2 positions shown; findings below may reference images not displayed]

FINDINGS: Unchanged right chest wall port catheter. The heart size and
mediastinal contours are within normal limits. Normal pulmonary
vascularity. Focal opacities in the left lower lobe and peripheral
right upper lobe, more conspicuous when compared to the study from
earlier today and certainly new compared to the prior study from 12
days ago. No pleural effusion or pneumothorax. Unchanged linear
scarring in the lingula. No acute osseous abnormality.
IMPRESSION: 1. Very mild multifocal pneumonia, new since 12 days ago.

## 2023-03-02 ENCOUNTER — Emergency Department (HOSPITAL_BASED_OUTPATIENT_CLINIC_OR_DEPARTMENT_OTHER): Payer: 59

## 2023-03-02 ENCOUNTER — Other Ambulatory Visit: Payer: Self-pay

## 2023-03-02 ENCOUNTER — Encounter (HOSPITAL_BASED_OUTPATIENT_CLINIC_OR_DEPARTMENT_OTHER): Payer: Self-pay | Admitting: Emergency Medicine

## 2023-03-02 ENCOUNTER — Emergency Department (HOSPITAL_BASED_OUTPATIENT_CLINIC_OR_DEPARTMENT_OTHER)
Admission: EM | Admit: 2023-03-02 | Discharge: 2023-03-02 | Disposition: A | Discharge status: Home / Self Care | Payer: 59 | Attending: Emergency Medicine | Admitting: Emergency Medicine

## 2023-03-02 DIAGNOSIS — J101 Influenza due to other identified influenza virus with other respiratory manifestations: Secondary | ICD-10-CM | POA: Insufficient documentation

## 2023-03-02 DIAGNOSIS — R509 Fever, unspecified: Secondary | ICD-10-CM | POA: Diagnosis present

## 2023-03-02 DIAGNOSIS — R000 Tachycardia, unspecified: Secondary | ICD-10-CM | POA: Insufficient documentation

## 2023-03-02 LAB — RESP PANEL BY RT-PCR (RSV, FLU A&B, COVID)  RVPGX2
Influenza A by PCR: POSITIVE — AB
Influenza B by PCR: NEGATIVE
Resp Syncytial Virus by PCR: NEGATIVE
SARS Coronavirus 2 by RT PCR: NEGATIVE

## 2023-03-02 MED ORDER — ACETAMINOPHEN 325 MG PO TABS
650.0000 mg | ORAL_TABLET | Freq: Once | ORAL | Status: AC | PRN
  Administered 2023-03-02: 650 mg via ORAL
  Filled 2023-03-02: qty 2

## 2023-03-02 MED ORDER — OSELTAMIVIR PHOSPHATE 75 MG PO CAPS: 75.0000 mg | ORAL_CAPSULE | Freq: Two times a day (BID) | ORAL | 0 refills | Status: AC

## 2023-03-02 NOTE — Discharge Instructions (Addendum)
It was our pleasure to provide your ER care today - we hope that you feel better. Your flu test is positive.   Drink plenty of fluids/stay well hydrated. You may take tamiflu as prescribed.   You may try nyquil, mucinex, robitussin or similar over the counter cold and flu medication for symptom relief.   Follow up with primary care doctor in two weeks if symptoms fail to improve/resolve.  Return to ER if worse, new symptoms, increased trouble breathing, or other emergency concern.

## 2023-03-02 NOTE — ED Triage Notes (Signed)
Pt c/o cough, bodyaches x2 days.  Highest temp 102.0 today, did not medicate at home.

## 2023-03-02 NOTE — ED Provider Notes (Addendum)
[place] [facility] Provider Note   CSN: [id number] Arrival date & time: 03/02/23  1050     History  Chief Complaint  Patient presents with   URI    Lucas Parker is a 63 y.o. male.  Pt with c/o fever, cough, occasionally with clear phlegm, nasal congestion, rhinorrhea, body aches, in past two days. No specific known ill contacts or known covid/flu exposure. No sore throat or trouble swallowing. No chest pain or sob. No abd pain or nvd. No dysuria or gu c/o. No extremity pain or swelling. No severe headaches or neck pain/stiffness. No rash.   The history is provided by the patient, medical records and a relative.  URI Presenting symptoms: congestion, cough, fever and rhinorrhea   Presenting symptoms: no ear pain and no sore throat   Associated symptoms: myalgias   Associated symptoms: no headaches, no neck pain and no sinus pain        Home Medications Prior to Admission medications   Medication Sig Start Date End Date Taking? Authorizing Provider  albuterol (VENTOLIN HFA) 108 (90 Base) MCG/ACT inhaler Inhale 1-2 puffs into the lungs every 6 (six) hours as needed for wheezing or shortness of breath.    [provider]  amLODipine (NORVASC) 5 MG tablet TAKE 1 TABLET BY MOUTH DAILY. 06/28/18   [provider]  amoxicillin (AMOXIL) 500 MG capsule Take 1,000 mg by mouth 2 (two) times daily. 7 days    [provider]  atorvastatin (LIPITOR) 20 MG tablet Take by mouth. 07/15/18   [provider]  azithromycin (ZITHROMAX) 250 MG tablet Take 250 mg by mouth daily.    [provider]  baclofen (LIORESAL) 10 MG tablet Take by mouth. 11/27/17   [provider]  clindamycin (CLEOCIN T) 1 % external solution Apply twice a day on back lesions 01/29/18   [provider]  dapsone 100 MG tablet Take by mouth. 07/31/18   [provider]  Dextromethorphan-guaiFENesin (ROBITUSSIN COUGH+CHEST  CONG DM) 20-200 MG/20ML LIQD Take by mouth. 05/03/18   [provider]  famotidine (PEPCID) 10 MG tablet Take by mouth. 07/31/18   [provider]  fluticasone (FLOVENT HFA) 220 MCG/ACT inhaler Inhale into the lungs. 03/20/18   [provider]  hydrochlorothiazide (HYDRODIURIL) 12.5 MG tablet Take by mouth. 07/15/18   [provider]  HYDROcodone-acetaminophen (NORCO) 5-325 MG tablet Take 1 tablet by mouth every 4 (four) hours as needed (for pain). 08/30/18   Molpus, John, MD  hydrOXYzine (ATARAX/VISTARIL) 25 MG tablet Take by mouth. 03/08/18   [provider]  loratadine (CLARITIN) 10 MG tablet Take by mouth. 07/31/18 10/29/18  [provider]  ondansetron (ZOFRAN) 8 MG tablet Take by mouth. 04/01/18   [provider]  pantoprazole (PROTONIX) 40 MG tablet Take by mouth. 07/15/18   [provider]  polyvinyl alcohol (LIQUIFILM TEARS) 1.4 % ophthalmic solution Place 2 drops in both eyes every 4 hours (alternating with PredForte eye drops) until 1/2 at 1:00 am. 01/08/18   [provider]  potassium chloride SA (K-DUR) 20 MEQ tablet Take by mouth. 05/03/18   [provider]  prochlorperazine (COMPAZINE) 10 MG tablet Take by mouth. 11/06/17   [provider]  Zanubrutinib 80 MG CAPS Take by mouth. 05/30/18   [provider]      Allergies    Lisinopril and Bactrim [sulfamethoxazole-trimethoprim]    Review of Systems   Review of Systems  Constitutional:  Positive  for fever.  HENT:  Positive for congestion and rhinorrhea. Negative for ear pain, sinus pain and sore throat.   Eyes:  Negative for redness.  Respiratory:  Positive for cough. Negative for shortness of breath.   Cardiovascular:  Negative for chest pain.  Gastrointestinal:  Negative for abdominal pain, diarrhea and vomiting.  Genitourinary:  Negative for dysuria.  Musculoskeletal:  Positive for myalgias. Negative for back pain, neck pain and  neck stiffness.  Skin:  Negative for rash.  Neurological:  Negative for headaches.    Physical Exam Updated Vital Signs BP 123/81   Pulse (!) 105   Temp (!) 102.1 F (38.9 C) (Oral)   Resp 18   Ht 1.702 m (5\' 7" )   Wt 104.3 kg   SpO2 95%   BMI 36.02 kg/m  Physical Exam Vitals and nursing note reviewed.  Constitutional:      Appearance: Normal appearance. He is well-developed.     Comments: Febrile.   HENT:     Head: Atraumatic.     Right Ear: Tympanic membrane and ear canal normal.     Left Ear: Tympanic membrane and ear canal normal.     Nose: Congestion and rhinorrhea present.     Mouth/Throat:     Mouth: Mucous membranes are moist.     Pharynx: Oropharynx is clear. No oropharyngeal exudate or posterior oropharyngeal erythema.  Eyes:     General: No scleral icterus.    Conjunctiva/sclera: Conjunctivae normal.  Neck:     Trachea: No tracheal deviation.     Comments: No stiffness or rigidity Cardiovascular:     Rate and Rhythm: Regular rhythm. Tachycardia present.     Pulses: Normal pulses.     Heart sounds: Normal heart sounds. No murmur heard.    No friction rub. No gallop.  Pulmonary:     Effort: Pulmonary effort is normal. No accessory muscle usage or respiratory distress.     Breath sounds: Normal breath sounds.  Abdominal:     General: Bowel sounds are normal. There is no distension.     Palpations: Abdomen is soft.     Tenderness: There is no abdominal tenderness.  Genitourinary:    Comments: No cva tenderness. Musculoskeletal:        General: No swelling or tenderness.     Cervical back: Normal range of motion and neck supple. No rigidity.     Right lower leg: No edema.     Left lower leg: No edema.  Lymphadenopathy:     Cervical: No cervical adenopathy.  Skin:    General: Skin is warm and dry.     Findings: No rash.  Neurological:     Mental Status: He is alert.     Comments: Alert, speech clear.   Psychiatric:        Mood and Affect: Mood  normal.     ED Results / Procedures / Treatments   Labs (all labs ordered are listed, but only abnormal results are displayed) Results for orders placed or performed during the hospital encounter of 03/02/23  Resp panel by RT-PCR (RSV, Flu A&B, Covid) Anterior Nasal Swab   Collection Time: 03/02/23 11:16 AM   Specimen: Anterior Nasal Swab  Result Value Ref Range   SARS Coronavirus 2 by RT PCR NEGATIVE NEGATIVE   Influenza A by PCR POSITIVE (A) NEGATIVE   Influenza B by PCR NEGATIVE NEGATIVE   Resp Syncytial Virus by PCR NEGATIVE NEGATIVE     EKG None  Radiology No results  found.  Procedures Procedures    Medications Ordered in ED Medications  acetaminophen (TYLENOL) tablet 650 mg (650 mg Oral Given 03/02/23 1118)    ED Course/ Medical Decision Making/ A&P                                 Medical Decision Making Problems Addressed: Acute febrile illness: acute illness or injury with systemic symptoms Influenza A: acute illness or injury with systemic symptoms that poses a threat to life or bodily functions  Amount and/or Complexity of Data Reviewed Independent Historian:     Details: Family, hx External Data Reviewed: notes. Labs: ordered. Decision-making details documented in ED Course. Radiology: ordered and independent interpretation performed. Decision-making details documented in ED Course.  Risk OTC drugs. Decision regarding hospitalization.  Labs ordered/sent. Imaging ordered.   Differential diagnosis includes pneumonia, covid, flu, etc. Dispo decision including potential need for admission considered if pna - will get labs and imaging and reassess.   Reviewed nursing notes and prior charts for additional history. External reports reviewed. Additional history from: family member.   Labs reviewed/interpreted by me - flu positive.   Xrays reviewed/interpreted by me - no pna.   Acetaminophen po. Po fluids.   Pt breathing comfortably. Current room air  sats 98%. Pt appears stable for ED d/c.  Pt indicates would like rx tamiflu - provided.   Rec close pcp f/u.  Return precautions provided.          Final Clinical Impression(s) / ED Diagnoses Final diagnoses:  Influenza A  Acute febrile illness    Rx / DC Orders ED Discharge Orders     None           Cathren Laine, MD 03/02/23 1300

## 2023-03-02 NOTE — ED Notes (Signed)
 Patient transported to X-ray
